# Patient Record
Sex: Male | Born: 1980 | Race: White | Hispanic: No | Marital: Single | Smoking: Current some day smoker
Health system: Southern US, Community
[De-identification: ages and names within clinical notes are randomized; demographics above are authoritative.]

## PROBLEM LIST (undated history)

## (undated) DIAGNOSIS — F988 Other specified behavioral and emotional disorders with onset usually occurring in childhood and adolescence: Secondary | ICD-10-CM

## (undated) DIAGNOSIS — M1990 Unspecified osteoarthritis, unspecified site: Secondary | ICD-10-CM

## (undated) DIAGNOSIS — G809 Cerebral palsy, unspecified: Secondary | ICD-10-CM

## (undated) DIAGNOSIS — G801 Spastic diplegic cerebral palsy: Secondary | ICD-10-CM

## (undated) HISTORY — PX: BACK SURGERY: SHX140

## (undated) HISTORY — PX: HAMMER TOE SURGERY: SHX385

## (undated) HISTORY — PX: OTHER SURGICAL HISTORY: SHX169

---

## 2003-07-28 ENCOUNTER — Encounter: Admission: RE | Admit: 2003-07-28 | Discharge: 2003-07-28 | Payer: Self-pay | Admitting: Internal Medicine

## 2013-04-04 ENCOUNTER — Ambulatory Visit: Payer: Self-pay

## 2019-11-21 ENCOUNTER — Emergency Department (HOSPITAL_COMMUNITY)
Admission: EM | Admit: 2019-11-21 | Discharge: 2019-11-21 | Discharge status: Against Medical Advice | Payer: Medicare Other | Attending: Emergency Medicine | Admitting: Emergency Medicine

## 2019-11-21 ENCOUNTER — Encounter (HOSPITAL_COMMUNITY): Payer: Self-pay

## 2019-11-21 ENCOUNTER — Other Ambulatory Visit: Payer: Self-pay

## 2019-11-21 DIAGNOSIS — R109 Unspecified abdominal pain: Secondary | ICD-10-CM | POA: Insufficient documentation

## 2019-11-21 DIAGNOSIS — M545 Low back pain: Secondary | ICD-10-CM | POA: Diagnosis not present

## 2019-11-21 HISTORY — DX: Spastic diplegic cerebral palsy: G80.1

## 2019-11-21 HISTORY — DX: Cerebral palsy, unspecified: G80.9

## 2019-11-21 HISTORY — DX: Other specified behavioral and emotional disorders with onset usually occurring in childhood and adolescence: F98.8

## 2019-11-21 HISTORY — DX: Unspecified osteoarthritis, unspecified site: M19.90

## 2019-11-21 NOTE — ED Triage Notes (Signed)
Patient states he woke at 0200 today, had right lower back pain. Patient states after he drank water his pain subsided while arriving to the ED. Patient denies any N/V/D.

## 2021-11-10 ENCOUNTER — Ambulatory Visit
Admission: RE | Admit: 2021-11-10 | Discharge: 2021-11-10 | Disposition: A | Discharge status: Home / Self Care | Payer: Medicare Other | Source: Ambulatory Visit | Attending: Internal Medicine | Admitting: Internal Medicine

## 2021-11-10 ENCOUNTER — Other Ambulatory Visit: Payer: Self-pay | Admitting: Internal Medicine

## 2021-11-10 DIAGNOSIS — M25552 Pain in left hip: Secondary | ICD-10-CM

## 2021-11-10 DIAGNOSIS — M5442 Lumbago with sciatica, left side: Secondary | ICD-10-CM

## 2023-05-07 DIAGNOSIS — Z0000 Encounter for general adult medical examination without abnormal findings: Secondary | ICD-10-CM | POA: Diagnosis not present

## 2023-05-07 DIAGNOSIS — Z136 Encounter for screening for cardiovascular disorders: Secondary | ICD-10-CM | POA: Diagnosis not present

## 2023-05-07 DIAGNOSIS — G801 Spastic diplegic cerebral palsy: Secondary | ICD-10-CM | POA: Diagnosis not present

## 2023-05-31 IMAGING — CR DG HIP (WITH OR WITHOUT PELVIS) 2-3V*L*
2 series · 2 of 2 positions shown · non-contrast
Comparison: None Available.

CLINICAL DATA: Acute left-sided back pain, hip pain.

EXAM:
DG HIP (WITH OR WITHOUT PELVIS) 2-3V LEFT

[t pelvis a.p.]
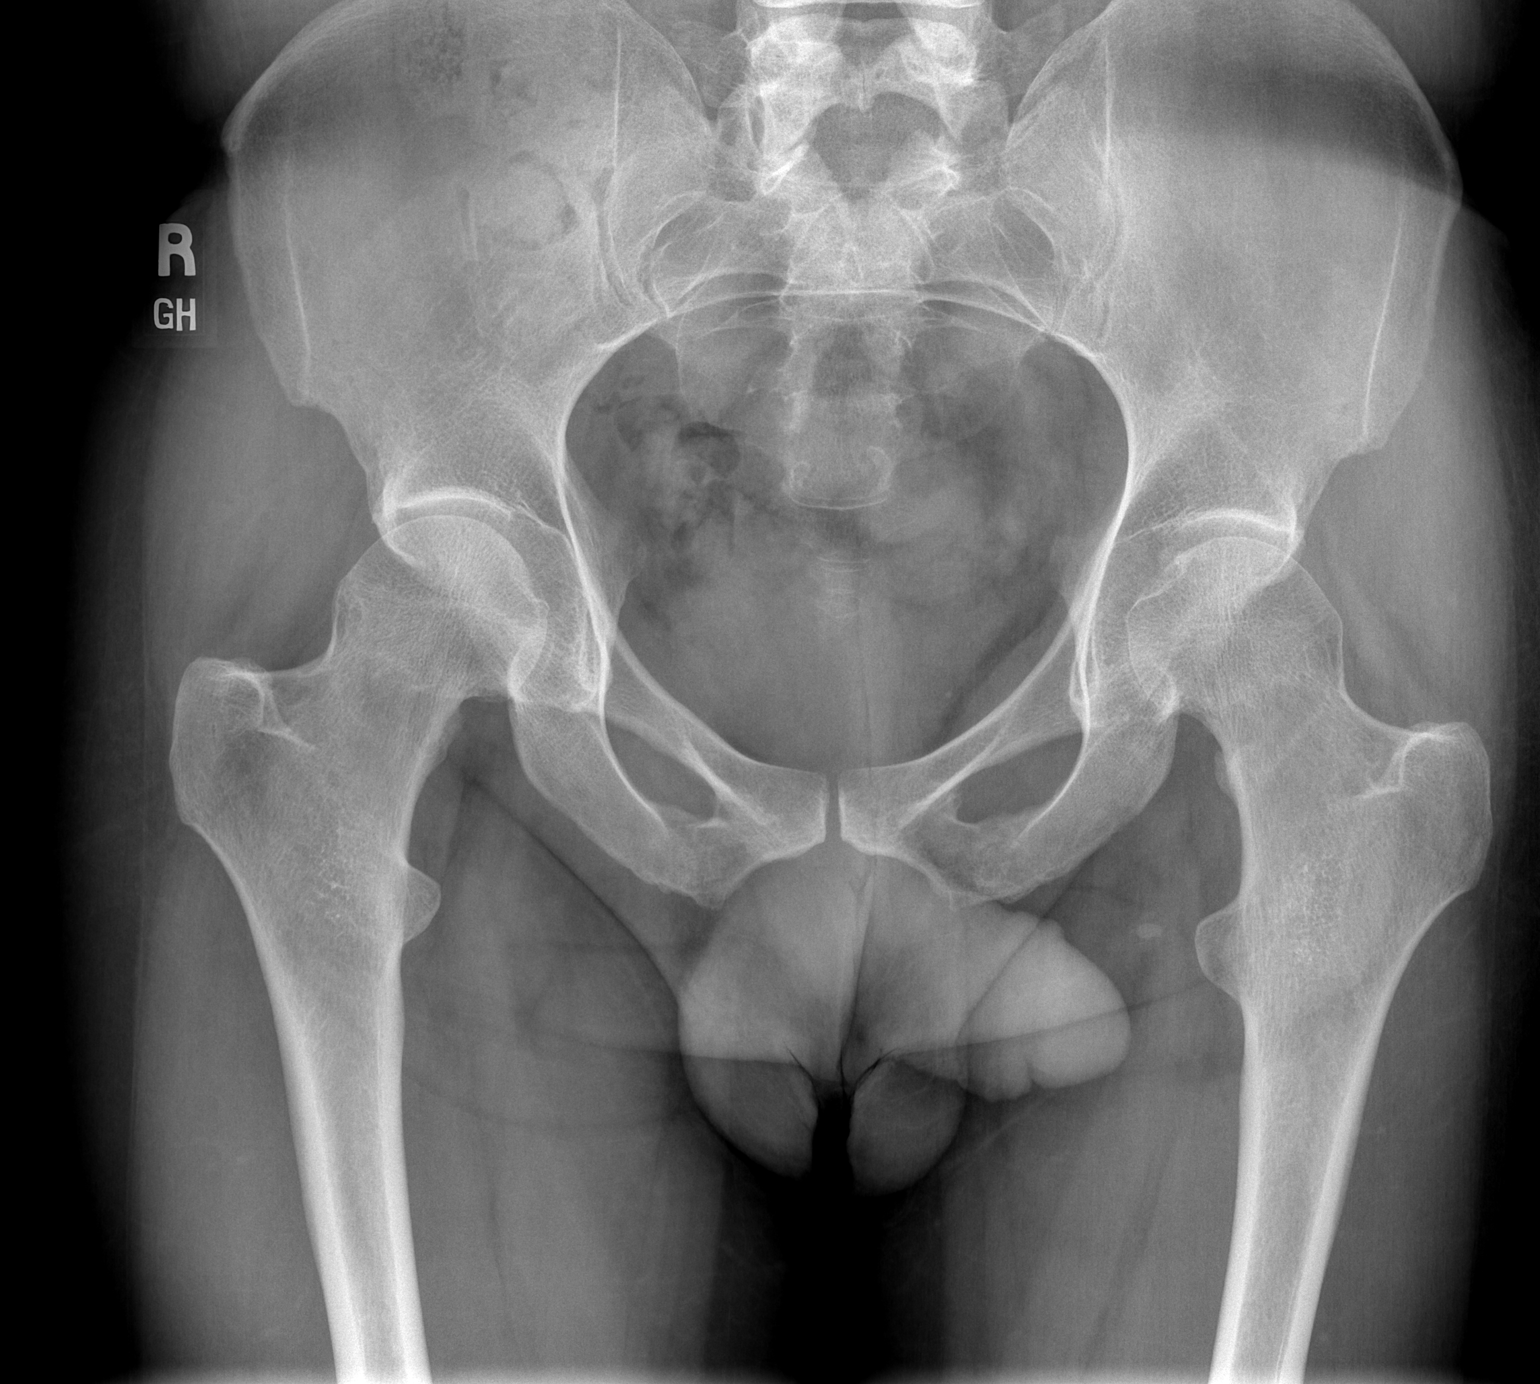

[t hip frog leg left]
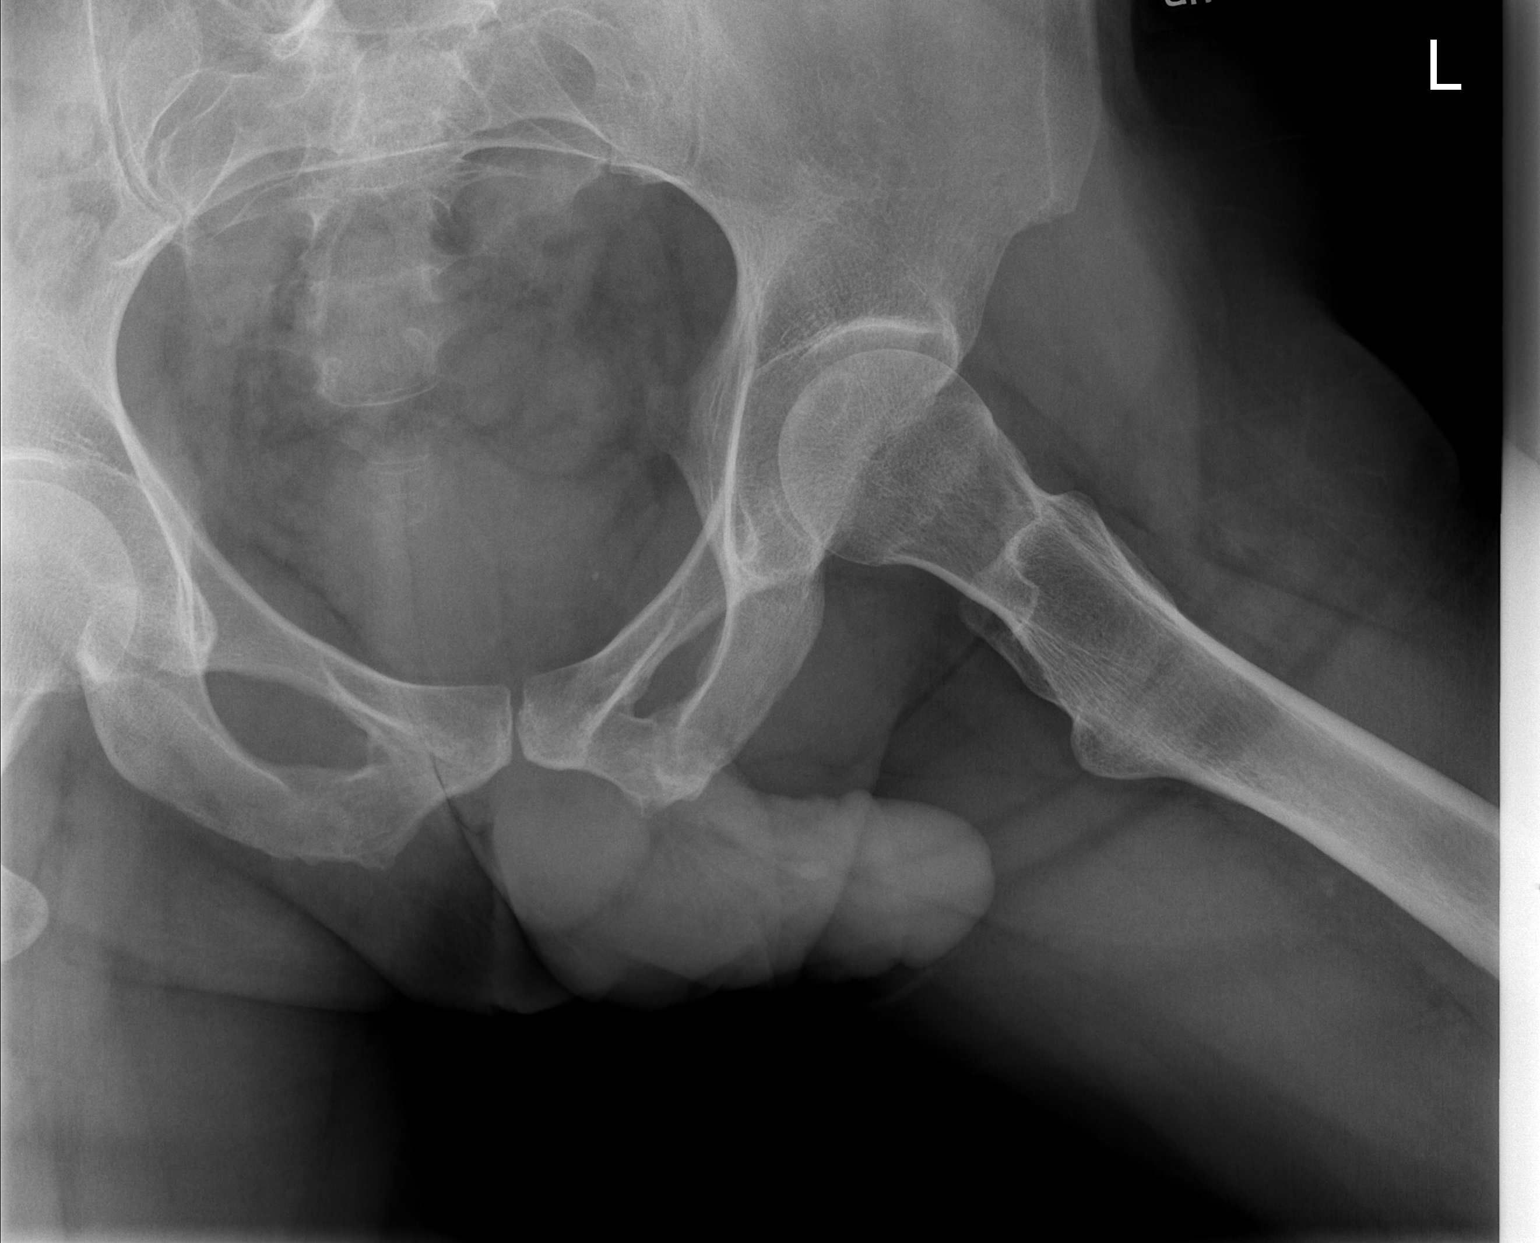

[2 of 2 positions shown; findings below may reference images not displayed]

FINDINGS: There is no evidence of hip fracture or dislocation. There is no
evidence of arthropathy or other focal bone abnormality.
IMPRESSION: Negative.

## 2023-10-04 DIAGNOSIS — T162XXA Foreign body in left ear, initial encounter: Secondary | ICD-10-CM | POA: Diagnosis not present

## 2024-02-01 DIAGNOSIS — R030 Elevated blood-pressure reading, without diagnosis of hypertension: Secondary | ICD-10-CM | POA: Diagnosis not present

## 2024-03-03 DIAGNOSIS — Z13828 Encounter for screening for other musculoskeletal disorder: Secondary | ICD-10-CM | POA: Diagnosis not present

## 2024-03-03 DIAGNOSIS — M4145 Neuromuscular scoliosis, thoracolumbar region: Secondary | ICD-10-CM | POA: Diagnosis not present

## 2024-03-03 DIAGNOSIS — Q66212 Congenital metatarsus primus varus, left foot: Secondary | ICD-10-CM | POA: Diagnosis not present

## 2024-03-03 DIAGNOSIS — M216X1 Other acquired deformities of right foot: Secondary | ICD-10-CM | POA: Diagnosis not present

## 2024-03-03 DIAGNOSIS — L84 Corns and callosities: Secondary | ICD-10-CM | POA: Diagnosis not present

## 2024-03-03 DIAGNOSIS — M438X4 Other specified deforming dorsopathies, thoracic region: Secondary | ICD-10-CM | POA: Diagnosis not present

## 2024-03-03 DIAGNOSIS — M2042 Other hammer toe(s) (acquired), left foot: Secondary | ICD-10-CM | POA: Diagnosis not present

## 2024-03-03 DIAGNOSIS — K5900 Constipation, unspecified: Secondary | ICD-10-CM | POA: Diagnosis not present

## 2024-03-03 DIAGNOSIS — G801 Spastic diplegic cerebral palsy: Secondary | ICD-10-CM | POA: Diagnosis not present

## 2024-03-03 DIAGNOSIS — M438X6 Other specified deforming dorsopathies, lumbar region: Secondary | ICD-10-CM | POA: Diagnosis not present
